# Patient Record
Sex: Female | Born: 1955
Health system: Southern US, Community
[De-identification: ages and names within clinical notes are randomized; demographics above are authoritative.]

## PROBLEM LIST (undated history)

## (undated) DIAGNOSIS — Z803 Family history of malignant neoplasm of breast: Secondary | ICD-10-CM

## (undated) DIAGNOSIS — M810 Age-related osteoporosis without current pathological fracture: Secondary | ICD-10-CM

## (undated) DIAGNOSIS — D649 Anemia, unspecified: Secondary | ICD-10-CM

## (undated) DIAGNOSIS — F419 Anxiety disorder, unspecified: Secondary | ICD-10-CM

## (undated) HISTORY — PX: DILATION AND CURETTAGE OF UTERUS: SHX78

## (undated) HISTORY — DX: Age-related osteoporosis without current pathological fracture: M81.0

## (undated) HISTORY — DX: Family history of malignant neoplasm of breast: Z80.3

## (undated) HISTORY — DX: Anxiety disorder, unspecified: F41.9

## (undated) HISTORY — DX: Anemia, unspecified: D64.9

---

## 1989-12-02 HISTORY — PX: TUBAL LIGATION: SHX77

## 2005-07-08 ENCOUNTER — Ambulatory Visit: Payer: Self-pay

## 2007-01-27 ENCOUNTER — Ambulatory Visit: Payer: Self-pay

## 2008-01-26 ENCOUNTER — Ambulatory Visit: Payer: Self-pay

## 2008-01-28 ENCOUNTER — Ambulatory Visit: Payer: Self-pay

## 2009-02-16 ENCOUNTER — Ambulatory Visit: Payer: Self-pay

## 2010-02-27 ENCOUNTER — Ambulatory Visit: Payer: Self-pay

## 2010-05-14 ENCOUNTER — Ambulatory Visit: Payer: Self-pay

## 2010-05-17 ENCOUNTER — Ambulatory Visit: Payer: Self-pay

## 2010-08-02 HISTORY — PX: COLONOSCOPY: SHX174

## 2010-08-27 ENCOUNTER — Ambulatory Visit: Payer: Self-pay | Admitting: Gastroenterology

## 2011-03-19 ENCOUNTER — Ambulatory Visit: Payer: Self-pay

## 2012-04-14 ENCOUNTER — Ambulatory Visit: Payer: Self-pay | Admitting: Endocrinology

## 2013-10-07 ENCOUNTER — Ambulatory Visit: Payer: Self-pay | Admitting: Endocrinology

## 2014-11-07 ENCOUNTER — Ambulatory Visit: Payer: Self-pay | Admitting: Endocrinology

## 2016-01-30 ENCOUNTER — Other Ambulatory Visit: Payer: Self-pay | Admitting: Endocrinology

## 2016-01-30 DIAGNOSIS — Z1231 Encounter for screening mammogram for malignant neoplasm of breast: Secondary | ICD-10-CM

## 2016-02-02 ENCOUNTER — Ambulatory Visit
Admission: RE | Admit: 2016-02-02 | Discharge: 2016-02-02 | Disposition: A | Discharge status: Home / Self Care | Payer: BLUE CROSS/BLUE SHIELD | Source: Ambulatory Visit | Attending: Endocrinology | Admitting: Endocrinology

## 2016-02-02 DIAGNOSIS — Z1231 Encounter for screening mammogram for malignant neoplasm of breast: Secondary | ICD-10-CM | POA: Insufficient documentation

## 2016-08-23 ENCOUNTER — Encounter: Payer: Self-pay | Admitting: Podiatry

## 2016-08-23 ENCOUNTER — Ambulatory Visit (INDEPENDENT_AMBULATORY_CARE_PROVIDER_SITE_OTHER): Payer: BLUE CROSS/BLUE SHIELD

## 2016-08-23 ENCOUNTER — Ambulatory Visit (INDEPENDENT_AMBULATORY_CARE_PROVIDER_SITE_OTHER): Payer: BLUE CROSS/BLUE SHIELD | Admitting: Podiatry

## 2016-08-23 VITALS — BP 113/71 | HR 76 | Resp 16 | Ht 64.0 in | Wt 144.0 lb

## 2016-08-23 DIAGNOSIS — M79672 Pain in left foot: Secondary | ICD-10-CM

## 2016-08-23 DIAGNOSIS — M722 Plantar fascial fibromatosis: Secondary | ICD-10-CM

## 2016-08-23 MED ORDER — DICLOFENAC SODIUM 75 MG PO TBEC: 75.0000 mg | DELAYED_RELEASE_TABLET | Freq: Two times a day (BID) | ORAL | 2 refills | Status: DC

## 2016-08-23 MED ORDER — TRIAMCINOLONE ACETONIDE 10 MG/ML IJ SUSP
10.0000 mg | Freq: Once | INTRAMUSCULAR | Status: AC
  Administered 2016-08-23: 10 mg

## 2016-08-23 NOTE — Patient Instructions (Signed)

## 2016-08-23 NOTE — Progress Notes (Signed)
   Subjective:    Patient ID: Audrey DeedWendy M Kinkead, female    DOB: 1956-01-17, 60 y.o.   MRN: [id number]  HPI Chief Complaint  Patient presents with  . Foot Pain    Left foot; heel; pt stated, "Pain is worse in the morning"; x6 month      Review of Systems  Constitutional: Positive for fatigue and unexpected weight change.  Psychiatric/Behavioral: The patient is nervous/anxious.   All other systems reviewed and are negative.      Objective:   Physical Exam        Assessment & Plan:

## 2016-08-25 NOTE — Progress Notes (Signed)
Subjective:     Patient ID: Audrey Leonard, female   DOB: 1956-09-05, 60 y.o.   MRN: [id number]  HPI patient states that she's getting a lot of pain in the plantar of the left heel and it's been present for at least 6 months and her daughter is getting married in the next several weeks   Review of Systems  All other systems reviewed and are negative.      Objective:   Physical Exam  Constitutional: She is oriented to person, place, and time.  Cardiovascular: Intact distal pulses.   Musculoskeletal: Normal range of motion.  Neurological: She is oriented to person, place, and time.  Skin: Skin is warm.  Vitals reviewed.  neurovascular status intact muscle strength adequate range of motion within normal limits with patient found to have acute inflammatory pain plantar aspect left heel at the insertional point tendon into the calcaneus with inflammation and fluid around the medial band. Patient's noted to have moderate depression of the arch does have good digital perfusion and is well oriented 3     Assessment:     Inflammatory fasciitis left heel at the insertional point of the tendon into the calcaneus with fluid buildup noted    Plan:     H&P x-rays reviewed and injected the plantar fascial left 3 mg Kenalog 5 mg Xylocaine and applied fascial brace. Gave instructions on physical therapy anti-inflammatories and long-term orthotics and patient will be reviewed again in the next several weeks  X-ray report indicated spur formation with no indications of stress fracture or arthritis

## 2016-09-06 ENCOUNTER — Ambulatory Visit (INDEPENDENT_AMBULATORY_CARE_PROVIDER_SITE_OTHER): Payer: BLUE CROSS/BLUE SHIELD | Admitting: Podiatry

## 2016-09-06 ENCOUNTER — Encounter: Payer: Self-pay | Admitting: Podiatry

## 2016-09-06 DIAGNOSIS — M722 Plantar fascial fibromatosis: Secondary | ICD-10-CM | POA: Diagnosis not present

## 2016-09-06 MED ORDER — TRIAMCINOLONE ACETONIDE 10 MG/ML IJ SUSP
10.0000 mg | Freq: Once | INTRAMUSCULAR | Status: AC
  Administered 2016-09-06: 10 mg

## 2016-09-06 NOTE — Patient Instructions (Signed)

## 2016-09-08 NOTE — Progress Notes (Signed)
Subjective:     Patient ID: Audrey Leonard, female   DOB: 02/12/1956, 60 y.o.   MRN: [id number]  HPI patient states she still having some pain but she's had definite improvement from previous visit   Review of Systems     Objective:   Physical Exam Neurovascular status intact with patient noted to have continued discomfort in the plantar heel left but improved from previous visit    Assessment:     Fasciitis-like symptoms improved but present    Plan:     Due to continued discomfort reinjected the plantar fascial left 3 mg Kenalog 5 mg Xylocaine and went ahead today and scanned for custom orthotics to reduce plantar pressure. Reappoint when orthotics returned

## 2016-09-26 ENCOUNTER — Ambulatory Visit (INDEPENDENT_AMBULATORY_CARE_PROVIDER_SITE_OTHER): Payer: BLUE CROSS/BLUE SHIELD | Admitting: Podiatry

## 2016-09-26 DIAGNOSIS — M722 Plantar fascial fibromatosis: Secondary | ICD-10-CM

## 2016-09-26 NOTE — Patient Instructions (Signed)

## 2016-11-18 ENCOUNTER — Other Ambulatory Visit: Payer: Self-pay | Admitting: Podiatry

## 2016-11-19 NOTE — Telephone Encounter (Signed)
Pt needs an appt prior to future refills. 

## 2016-12-13 ENCOUNTER — Other Ambulatory Visit: Payer: Self-pay | Admitting: Podiatry

## 2016-12-13 NOTE — Telephone Encounter (Signed)
Pt needs an appt prior to future refills. 

## 2017-02-28 ENCOUNTER — Other Ambulatory Visit: Payer: Self-pay | Admitting: Endocrinology

## 2017-02-28 DIAGNOSIS — Z1231 Encounter for screening mammogram for malignant neoplasm of breast: Secondary | ICD-10-CM

## 2017-03-26 ENCOUNTER — Ambulatory Visit
Admission: RE | Admit: 2017-03-26 | Discharge: 2017-03-26 | Disposition: A | Discharge status: Home / Self Care | Payer: BLUE CROSS/BLUE SHIELD | Source: Ambulatory Visit | Attending: Endocrinology | Admitting: Endocrinology

## 2017-03-26 DIAGNOSIS — Z1231 Encounter for screening mammogram for malignant neoplasm of breast: Secondary | ICD-10-CM | POA: Diagnosis present

## 2018-03-23 ENCOUNTER — Other Ambulatory Visit: Payer: Self-pay | Admitting: Endocrinology

## 2018-03-23 DIAGNOSIS — Z1231 Encounter for screening mammogram for malignant neoplasm of breast: Secondary | ICD-10-CM

## 2018-04-08 ENCOUNTER — Ambulatory Visit
Admission: RE | Admit: 2018-04-08 | Discharge: 2018-04-08 | Disposition: A | Discharge status: Home / Self Care | Payer: BLUE CROSS/BLUE SHIELD | Source: Ambulatory Visit | Attending: Endocrinology | Admitting: Endocrinology

## 2018-04-08 DIAGNOSIS — Z1231 Encounter for screening mammogram for malignant neoplasm of breast: Secondary | ICD-10-CM | POA: Diagnosis not present

## 2018-04-16 ENCOUNTER — Ambulatory Visit (INDEPENDENT_AMBULATORY_CARE_PROVIDER_SITE_OTHER): Payer: BLUE CROSS/BLUE SHIELD | Admitting: Obstetrics and Gynecology

## 2018-04-16 ENCOUNTER — Encounter: Payer: Self-pay | Admitting: Obstetrics and Gynecology

## 2018-04-16 VITALS — BP 110/72 | HR 69 | Ht 64.0 in | Wt 170.0 lb

## 2018-04-16 DIAGNOSIS — N951 Menopausal and female climacteric states: Secondary | ICD-10-CM

## 2018-04-16 DIAGNOSIS — Z01419 Encounter for gynecological examination (general) (routine) without abnormal findings: Secondary | ICD-10-CM

## 2018-04-16 DIAGNOSIS — M810 Age-related osteoporosis without current pathological fracture: Secondary | ICD-10-CM | POA: Insufficient documentation

## 2018-04-16 DIAGNOSIS — Z803 Family history of malignant neoplasm of breast: Secondary | ICD-10-CM | POA: Diagnosis not present

## 2018-04-16 DIAGNOSIS — Z1211 Encounter for screening for malignant neoplasm of colon: Secondary | ICD-10-CM | POA: Diagnosis not present

## 2018-04-16 DIAGNOSIS — Z1231 Encounter for screening mammogram for malignant neoplasm of breast: Secondary | ICD-10-CM | POA: Diagnosis not present

## 2018-04-16 DIAGNOSIS — Z1151 Encounter for screening for human papillomavirus (HPV): Secondary | ICD-10-CM

## 2018-04-16 DIAGNOSIS — Z79890 Hormone replacement therapy (postmenopausal): Secondary | ICD-10-CM | POA: Diagnosis not present

## 2018-04-16 DIAGNOSIS — Z124 Encounter for screening for malignant neoplasm of cervix: Secondary | ICD-10-CM | POA: Diagnosis not present

## 2018-04-16 DIAGNOSIS — Z1239 Encounter for other screening for malignant neoplasm of breast: Secondary | ICD-10-CM

## 2018-04-16 DIAGNOSIS — F419 Anxiety disorder, unspecified: Secondary | ICD-10-CM | POA: Insufficient documentation

## 2018-04-16 MED ORDER — ESTRADIOL-NORETHINDRONE ACET 1-0.5 MG PO TABS
1.0000 | ORAL_TABLET | Freq: Every day | ORAL | 12 refills | Status: DC
Start: 2018-04-16 — End: 2018-04-20

## 2018-04-16 NOTE — Patient Instructions (Signed)
I value your feedback and entrusting us with your care. If you get a [place] patient survey, I would appreciate you taking the time to let us know about your experience today. Thank you! 

## 2018-04-16 NOTE — Progress Notes (Addendum)
PCP: System, Pcp Not In   Chief Complaint  Patient presents with  . Gynecologic Exam    HPI:      [name] is a 62 y.o. No obstetric history on file. who LMP was No LMP recorded. Patient is postmenopausal., presents today for her annual examination.  Her menses are absent due to menopause. She does not have intermenstrual bleeding. She does have vasomotor sx that started last fall. Was placed on estradiol 0.5 mg by Dr. Ronnald Collum with sx relief. Pt has her uterus, however, and isn't on a progesterone.  Sex activity: not sexually active. She does not have vaginal dryness.  Last Pap: January 16, 2017  Results were: ASCUS with NEGATIVE high risk HPV . Repeat due today.  Hx of STDs: none  Last mammogram: Apr 08, 2018  Results were: normal--routine follow-up in 12 months There is a FH of breast cancer in her MGM and 2 sisters, one of which had neg genetic testing at [facility] about 3 yrs ago (most likely [facility] panel). There is no FH of ovarian cancer. The patient does not do self-breast exams.  Colonoscopy: colonoscopy 10 years ago without abnormalities with Dr. Candace Cruise.  Repeat due after 10 years.  DEXA: 2014 with osteoporosis; treated with reclast by Dr. Ronnald Collum.   Tobacco use: The patient denies current or previous tobacco use. Alcohol use: social drinker Exercise: not active  She does get adequate calcium but not Vitamin D in her diet.  Labs with PCP.   Past Medical History:  Diagnosis Date  . Anemia   . Anxiety   . Family history of breast cancer   . Osteoporosis     Past Surgical History:  Procedure Laterality Date  . COLONOSCOPY  2011  . TUBAL LIGATION  1991    Family History  Problem Relation Age of Onset  . Breast cancer Sister 37  . Breast cancer Maternal Grandmother 60  . Breast cancer Sister 40       gene neg at [facility] about 2016    Social History   Socioeconomic History  . Marital status: Single    Spouse name: Not on file  . Number of children:  Not on file  . Years of education: Not on file  . Highest education level: Not on file  Occupational History  . Not on file  Social Needs  . Financial resource strain: Not on file  . Food insecurity:    Worry: Not on file    Inability: Not on file  . Transportation needs:    Medical: Not on file    Non-medical: Not on file  Tobacco Use  . Smoking status: Never Smoker  . Smokeless tobacco: Never Used  Substance and Sexual Activity  . Alcohol use: Not Currently  . Drug use: Never  . Sexual activity: Not Currently    Birth control/protection: Post-menopausal  Lifestyle  . Physical activity:    Days per week: Not on file    Minutes per session: Not on file  . Stress: Not on file  Relationships  . Social connections:    Talks on phone: Not on file    Gets together: Not on file    Attends religious service: Not on file    Active member of club or organization: Not on file    Attends meetings of clubs or organizations: Not on file    Relationship status: Not on file  . Intimate partner violence:    Fear of current or  ex partner: Not on file    Emotionally abused: Not on file    Physically abused: Not on file    Forced sexual activity: Not on file  Other Topics Concern  . Not on file  Social History Narrative  . Not on file    Outpatient Medications Prior to Visit  Medication Sig Dispense Refill  . ALPRAZolam (XANAX) 0.5 MG tablet Take 0.5 mg by mouth 2 (two) times daily as needed.  5  . estradiol (ESTRACE) 0.5 MG tablet Take 0.5 mg by mouth daily.  5  . ferrous gluconate (FERGON) 324 MG tablet Take 324 mg by mouth daily with breakfast.    . diclofenac (VOLTAREN) 75 MG EC tablet TAKE 1 TABLET (75 MG TOTAL) BY MOUTH 2 (TWO) TIMES DAILY. (Patient not taking: Reported on 04/16/2018) 24 tablet 0  . ALPRAZolam (XANAX) 0.25 MG tablet Take 0.25 mg by mouth daily.  5   No facility-administered medications prior to visit.     ROS:  Review of Systems  Constitutional: Negative  for fatigue, fever and unexpected weight change.  Respiratory: Negative for cough, shortness of breath and wheezing.   Cardiovascular: Negative for chest pain, palpitations and leg swelling.  Gastrointestinal: Negative for blood in stool, constipation, diarrhea, nausea and vomiting.  Endocrine: Negative for cold intolerance, heat intolerance and polyuria.  Genitourinary: Negative for dyspareunia, dysuria, flank pain, frequency, genital sores, hematuria, menstrual problem, pelvic pain, urgency, vaginal bleeding, vaginal discharge and vaginal pain.  Musculoskeletal: Negative for back pain, joint swelling and myalgias.  Skin: Negative for rash.  Neurological: Negative for dizziness, syncope, light-headedness, numbness and headaches.  Hematological: Negative for adenopathy.  Psychiatric/Behavioral: Negative for agitation, confusion, sleep disturbance and suicidal ideas. The patient is not nervous/anxious.   BREAST: No symptoms   Objective: BP 110/72   Pulse 69   Ht _0  (1.626 m)   Wt 170 lb (77.1 kg)   BMI 29.18 kg/m    Physical Exam  Constitutional: She is oriented to person, place, and time. She appears well-developed and well-nourished.  Genitourinary: Vagina normal and uterus normal. There is no rash or tenderness on the right labia. There is no rash or tenderness on the left labia. No erythema or tenderness in the vagina. No vaginal discharge found. Right adnexum does not display mass and does not display tenderness. Left adnexum does not display mass and does not display tenderness. Cervix does not exhibit motion tenderness or polyp. Uterus is not enlarged or tender.  Neck: Normal range of motion. No thyromegaly present.  Cardiovascular: Normal rate, regular rhythm and normal heart sounds.  No murmur heard. Pulmonary/Chest: Effort normal and breath sounds normal. Right breast exhibits no mass, no nipple discharge, no skin change and no tenderness. Left breast exhibits no mass, no  nipple discharge, no skin change and no tenderness.  Abdominal: Soft. There is no tenderness. There is no guarding.  Musculoskeletal: Normal range of motion.  Neurological: She is alert and oriented to person, place, and time. No cranial nerve deficit.  Psychiatric: She has a normal mood and affect. Her behavior is normal.  Vitals reviewed.   Assessment/Plan:  Encounter for annual routine gynecological examination  Cervical cancer screening - Plan: IGP, Aptima HPV  Screening for HPV (human papillomavirus) - Will call with results.  - Plan: IGP, Aptima HPV  Screening for breast cancer - Pt is current on mammo. At increased risk based on FH. Add monthly SBE, cont yearly CBE and mammos. Add Vit D3.  Family  history of breast cancer - Affected sister was gene neg, most likely full panel since done at [facility] ~3 yrs ago. Pt to confirm results. If just BRCA, pt qualifies for cancer genetic testing  Hormone replacement therapy (HRT) - Rx change from estradiol to activella for prog component. Rx eRxd. F/u prn sx. F/u prn DUB sx. - Plan: estradiol-norethindrone (ACTIVELLA) 1-0.5 MG tablet  Vasomotor symptoms due to menopause - Plan: estradiol-norethindrone (ACTIVELLA) 1-0.5 MG tablet  Screening for colon cancer - REfer to GI for scr colonoscopy due to age later this yr. - Plan: Ambulatory referral to Gastroenterology   Meds ordered this encounter  Medications  . estradiol-norethindrone (ACTIVELLA) 1-0.5 MG tablet    Sig: Take 1 tablet by mouth daily.    Dispense:  30 tablet    Refill:  12    Order Specific Question:   Supervising Provider    Answer:   Gae Dry [[id number]]            GYN counsel breast self exam, mammography screening, use and side effects of HRT, menopause, adequate intake of calcium and vitamin D, diet and exercise    F/U  Return in about 1 year (around 04/17/2019).  Shyquan Stallbaumer B. Velvet Moomaw, PA-C 04/16/2018 9:57 AM

## 2018-04-19 LAB — IGP, APTIMA HPV
HPV Aptima: NEGATIVE
PAP Smear Comment: 0

## 2018-04-20 ENCOUNTER — Encounter: Payer: Self-pay | Admitting: Obstetrics and Gynecology

## 2018-04-20 ENCOUNTER — Telehealth: Payer: Self-pay | Admitting: Obstetrics and Gynecology

## 2018-04-20 MED ORDER — PROGESTERONE MICRONIZED 100 MG PO CAPS: 100.0000 mg | ORAL_CAPSULE | Freq: Every day | ORAL | 3 refills | Status: DC

## 2018-04-20 MED ORDER — ESTRADIOL 0.5 MG PO TABS: 0.5000 mg | ORAL_TABLET | Freq: Every day | ORAL | 3 refills | Status: DC

## 2018-04-20 NOTE — Telephone Encounter (Signed)
Pt states activella Rx was very expensive. Would rather go back to estradiol 0.5 mg and add a prog Rx. Rx prometrium 100 mg QHS, Rx RF estradiol 0.5 mg. F/u prn.

## 2018-05-07 ENCOUNTER — Telehealth: Payer: Self-pay | Admitting: Obstetrics and Gynecology

## 2018-05-07 NOTE — Telephone Encounter (Signed)
Patient calling this morning stating she is having bleeding and cramping.  She says her hormone med was changed at last visit and told to give [facility] a call if this happened.

## 2018-05-07 NOTE — Telephone Encounter (Signed)
Needs to have u/s. Pls call pt to schedule and I will call with results. Thanks.

## 2018-05-08 NOTE — Telephone Encounter (Signed)
Pt definitely needs ultrasound to see lining of uterus since she was on estrogen without progesterone. Bleeding with HRT isn't normal so she needs further eval. If no ultrasound, she needs to stop all HRT. Pls let me know what she decides. Thx.

## 2018-05-08 NOTE — Telephone Encounter (Signed)
Patient is calling wanting to know if she needs to stop taking her hormone medication. Patient is still complaining of bleeding. Patient was advise to schedule ultrasound but has declined. Please advise

## 2018-05-11 NOTE — Telephone Encounter (Signed)
Spoke with pt re: PMB with HRT. Pt understands need for GYN u/s and poss EMB based on u/s results. Pt had 6 months of unopposed ERT from PCP. Still having heavy period bleeding. Hx of leio in past. Taking HRT QOD due to vasomotor sx.

## 2018-05-13 ENCOUNTER — Ambulatory Visit: Payer: BLUE CROSS/BLUE SHIELD

## 2018-05-13 ENCOUNTER — Encounter: Payer: Self-pay | Admitting: Obstetrics and Gynecology

## 2018-05-13 ENCOUNTER — Ambulatory Visit: Payer: BLUE CROSS/BLUE SHIELD | Admitting: Obstetrics and Gynecology

## 2018-05-13 ENCOUNTER — Other Ambulatory Visit: Payer: Self-pay | Admitting: Obstetrics & Gynecology

## 2018-05-13 VITALS — BP 120/80 | HR 45 | Ht 64.0 in | Wt 166.0 lb

## 2018-05-13 DIAGNOSIS — Z79890 Hormone replacement therapy (postmenopausal): Secondary | ICD-10-CM

## 2018-05-13 DIAGNOSIS — N950 Postmenopausal bleeding: Secondary | ICD-10-CM | POA: Diagnosis not present

## 2018-05-13 DIAGNOSIS — R9389 Abnormal findings on diagnostic imaging of other specified body structures: Secondary | ICD-10-CM

## 2018-05-13 NOTE — Progress Notes (Signed)
System, Pcp Not In   Chief Complaint  Patient presents with  . Follow-up    PMB, U/S RESULTS,     HPI:      [name] is a 62 y.o. [phone] who LMP was No LMP recorded. Patient is postmenopausal., presents today for u/s f/u for PMB with HRT. Pt has been postmenopausal for a couple yrs without bleeding. Was started on unopposed estradiol 0.5 mg by Dr. Patrecia Pace about 6 months ago with sx relief of vasomotor sx. Pt saw me for annual 04/16/18 and was changed to activella for progesterone component. She then started having period type bleeding last wk. She has been changing pads about QID, no clots. Flow is a little lighter today. Had cramping initially that resolved. Hx of leio in the past and pt thought sx related. She has stopped all HRT.   Past Medical History:  Diagnosis Date  . Anemia   . Anxiety   . Family history of breast cancer   . Osteoporosis     Past Surgical History:  Procedure Laterality Date  . COLONOSCOPY  08/2010   Dr. Bluford Kaufmann at [facility]; repeat due in 10 yrs.  . COLONOSCOPY  2010  . DILATION AND CURETTAGE OF UTERUS     x2; [facility]  . TUBAL LIGATION  1991    Family History  Problem Relation Age of Onset  . Breast cancer Sister 2  . Breast cancer Maternal Grandmother 60  . Breast cancer Sister 6       gene neg at [facility] about 2016  . Heart disease Father     Social History   Socioeconomic History  . Marital status: Divorced    Spouse name: Not on file  . Number of children: 3  . Years of education: 80  . Highest education level: Not on file  Occupational History  . Occupation: Human resources officer    Comment: SELF EMPLOYED  Social Needs  . Financial resource strain: Not on file  . Food insecurity:    Worry: Not on file    Inability: Not on file  . Transportation needs:    Medical: Not on file    Non-medical: Not on file  Tobacco Use  . Smoking status: Never Smoker  . Smokeless tobacco: Never Used  Substance and Sexual Activity  . Alcohol use: Not  Currently  . Drug use: Never  . Sexual activity: Not Currently    Birth control/protection: Post-menopausal  Lifestyle  . Physical activity:    Days per week: Not on file    Minutes per session: Not on file  . Stress: Not on file  Relationships  . Social connections:    Talks on phone: Not on file    Gets together: Not on file    Attends religious service: Not on file    Active member of club or organization: Not on file    Attends meetings of clubs or organizations: Not on file    Relationship status: Not on file  . Intimate partner violence:    Fear of current or ex partner: Not on file    Emotionally abused: Not on file    Physically abused: Not on file    Forced sexual activity: Not on file  Other Topics Concern  . Not on file  Social History Narrative  . Not on file    Outpatient Medications Prior to Visit  Medication Sig Dispense Refill  . ALPRAZolam (XANAX) 0.5 MG tablet Take 0.5 mg by  mouth 2 (two) times daily as needed.  5  . ferrous gluconate (FERGON) 324 MG tablet Take 324 mg by mouth daily with breakfast.    . diclofenac (VOLTAREN) 75 MG EC tablet TAKE 1 TABLET (75 MG TOTAL) BY MOUTH 2 (TWO) TIMES DAILY. (Patient not taking: Reported on 04/16/2018) 24 tablet 0  . estradiol (ESTRACE) 0.5 MG tablet Take 1 tablet (0.5 mg total) by mouth daily. (Patient not taking: Reported on 05/13/2018) 90 tablet 3  . progesterone (PROMETRIUM) 100 MG capsule Take 1 capsule (100 mg total) by mouth daily. (Patient not taking: Reported on 05/13/2018) 90 capsule 3   No facility-administered medications prior to visit.     ROS:  Review of Systems  Constitutional: Negative for fever.  Gastrointestinal: Negative for blood in stool, constipation, diarrhea, nausea and vomiting.  Genitourinary: Positive for menstrual problem. Negative for dyspareunia, dysuria, flank pain, frequency, hematuria, urgency, vaginal bleeding, vaginal discharge and vaginal pain.  Musculoskeletal: Negative for back  pain.  Skin: Negative for rash.   BREAST: No symptoms   OBJECTIVE:   Vitals:  BP 120/80   Pulse (!) 45   Ht 5\' 4"  (1.626 m)   Wt 166 lb (75.3 kg)   BMI 28.49 kg/m   Physical Exam  Constitutional: She is oriented to person, place, and time. Vital signs are normal. She appears well-developed.  Pulmonary/Chest: Effort normal.  Genitourinary: There is no rash, tenderness or lesion on the right labia. There is no rash, tenderness or lesion on the left labia. Cervix exhibits no discharge and no friability. There is bleeding in the vagina. No erythema or tenderness in the vagina. No vaginal discharge found.  Musculoskeletal: Normal range of motion.  Neurological: She is alert and oriented to person, place, and time.  Psychiatric: She has a normal mood and affect. Her behavior is normal. Thought content normal.  Vitals reviewed.   Results:       Endometrial Biopsy After discussion with the patient regarding her abnormal uterine bleeding I recommended that she proceed with an endometrial biopsy for further diagnosis. The risks, benefits, alternatives, and indications for an endometrial biopsy were discussed with the patient in detail. She understood the risks including infection, bleeding, cervical laceration and uterine perforation.  Verbal consent was obtained.   PROCEDURE NOTE:  Pipelle endometrial biopsy was performed using aseptic technique with iodine preparation.  The uterus was sounded to a length of 8.0 cm.  Adequate sampling was obtained with minimal blood loss.  The patient tolerated the procedure well.  Disposition will be pending pathology.  Assessment/Plan: Postmenopausal bleeding - S/P unopposed ERT. EM=10 mm on u/s. EMB today. Will f/u with pathology results and further mgmt.  - Plan: Pathology  Thickened endometrium - Plan: Pathology  Hormone replacement therapy (HRT) - Hold off on HRT currently. If pathology neg, can restart HRT eventually after tx.     Return  if symptoms worsen or fail to improve.  Markiya Keefe B. Malcom Selmer, PA-C 05/13/2018 11:31 AM

## 2018-05-13 NOTE — Patient Instructions (Signed)
I value your feedback and entrusting us with your care. If you get a [place] patient survey, I would appreciate you taking the time to let us know about your experience today. Thank you! 

## 2018-05-15 LAB — PATHOLOGY

## 2018-05-21 ENCOUNTER — Telehealth: Payer: Self-pay | Admitting: Obstetrics and Gynecology

## 2018-05-21 NOTE — Telephone Encounter (Signed)
Pt aware of neg EMB results after PMB from unopposed ERT from PCP. Bleeding stopped a few days ago. Pt's vasomotor sx are tolerable. Wants to hold off on HRT for now, but is a candidate if sx are bad in future. F/u prn.

## 2018-07-02 ENCOUNTER — Encounter: Payer: Self-pay | Admitting: Obstetrics and Gynecology

## 2019-04-22 ENCOUNTER — Ambulatory Visit: Payer: BLUE CROSS/BLUE SHIELD | Admitting: Obstetrics and Gynecology

## 2019-04-30 ENCOUNTER — Other Ambulatory Visit: Payer: Self-pay | Admitting: Endocrinology

## 2019-04-30 DIAGNOSIS — Z1231 Encounter for screening mammogram for malignant neoplasm of breast: Secondary | ICD-10-CM

## 2019-06-03 ENCOUNTER — Encounter: Payer: Self-pay | Admitting: Obstetrics and Gynecology

## 2019-06-03 ENCOUNTER — Ambulatory Visit (INDEPENDENT_AMBULATORY_CARE_PROVIDER_SITE_OTHER): Payer: BLUE CROSS/BLUE SHIELD | Admitting: Obstetrics and Gynecology

## 2019-06-03 ENCOUNTER — Other Ambulatory Visit: Payer: Self-pay

## 2019-06-03 VITALS — BP 118/70 | Ht 64.0 in | Wt 173.6 lb

## 2019-06-03 DIAGNOSIS — Z803 Family history of malignant neoplasm of breast: Secondary | ICD-10-CM

## 2019-06-03 DIAGNOSIS — Z01419 Encounter for gynecological examination (general) (routine) without abnormal findings: Secondary | ICD-10-CM | POA: Diagnosis not present

## 2019-06-03 DIAGNOSIS — Z1239 Encounter for other screening for malignant neoplasm of breast: Secondary | ICD-10-CM

## 2019-06-03 NOTE — Progress Notes (Signed)
PCP: System, Pcp Not In   Chief Complaint  Patient presents with  . Gynecologic Exam    HPI:      [name] is a 63 y.o. No obstetric history on file. who LMP was No LMP recorded. Patient is postmenopausal., presents today for her annual examination.  Her menses are absent due to menopause. She no longer has postmenopausal bleeding. Did HRT last yr with subsequent PMB and thickened EM on u/s. Had neg EMB. Pt decided to stop hormones and is fine without them.   Sex activity: not sexually active. She does not have vaginal dryness.  Last Pap: 04/16/18  Results were: no abnormalities/neg HPV DNA.  Hx of STDs: none  Last mammogram: Apr 08, 2018  Results were: normal--routine follow-up in 12 months. Has appt 06/11/19. There is a FH of breast cancer in her MGM and 2 sisters, one of which had neg genetic testing at [facility] about 3 yrs ago (most likely [facility] panel). There is no FH of ovarian cancer. The patient does not do self-breast exams.  Colonoscopy: colonoscopy 2011 without abnormalities with Dr. Candace Cruise.  Repeat due after 10 years.  DEXA: 2014 with osteoporosis; treated with reclast by Dr. Ronnald Collum. Has upcoming f/u.   Tobacco use: The patient denies current or previous tobacco use. Alcohol use: social drinker Exercise: not active  She does get adequate calcium but not Vitamin D in her diet. Doesn't take her Vit D supp.  Labs with PCP.   Past Medical History:  Diagnosis Date  . Anemia   . Anxiety   . Family history of breast cancer   . Osteoporosis     Past Surgical History:  Procedure Laterality Date  . COLONOSCOPY  08/2010   Dr. Candace Cruise at [facility]; repeat due in 10 yrs.  Marland Kitchen DILATION AND CURETTAGE OF UTERUS     x2; [facility]  . TUBAL LIGATION  1991    Family History  Problem Relation Age of Onset  . Breast cancer Sister 25  . Breast cancer Maternal Grandmother 60  . Breast cancer Sister 42       gene neg at [facility] about 2016  . Heart disease Father     Social History    Socioeconomic History  . Marital status: Divorced    Spouse name: Not on file  . Number of children: 3  . Years of education: 66  . Highest education level: Not on file  Occupational History  . Occupation: Development worker, community    Comment: SELF EMPLOYED  Social Needs  . Financial resource strain: Not on file  . Food insecurity    Worry: Not on file    Inability: Not on file  . Transportation needs    Medical: Not on file    Non-medical: Not on file  Tobacco Use  . Smoking status: Never Smoker  . Smokeless tobacco: Never Used  Substance and Sexual Activity  . Alcohol use: Not Currently  . Drug use: Never  . Sexual activity: Not Currently    Birth control/protection: Post-menopausal  Lifestyle  . Physical activity    Days per week: Not on file    Minutes per session: Not on file  . Stress: Not on file  Relationships  . Social Herbalist on phone: Not on file    Gets together: Not on file    Attends religious service: Not on file    Active member of club or organization: Not on file  Attends meetings of clubs or organizations: Not on file    Relationship status: Not on file  . Intimate partner violence    Fear of current or ex partner: Not on file    Emotionally abused: Not on file    Physically abused: Not on file    Forced sexual activity: Not on file  Other Topics Concern  . Not on file  Social History Narrative  . Not on file    Current Outpatient Medications:  .  ALPRAZolam (XANAX) 0.5 MG tablet, Take 0.5 mg by mouth 2 (two) times daily as needed., Disp: , Rfl: 5 .  escitalopram (LEXAPRO) 20 MG tablet, TAKE 1 TABLET BY MOUTH 1 TIME A DAY, Disp: , Rfl:  .  ferrous gluconate (FERGON) 324 MG tablet, Take 324 mg by mouth daily with breakfast., Disp: , Rfl:  .  Vitamin D, Ergocalciferol, (DRISDOL) 1.25 MG (50000 UT) CAPS capsule, , Disp: , Rfl:    ROS:  Review of Systems  Constitutional: Negative for fatigue, fever and unexpected weight change.   Respiratory: Negative for cough, shortness of breath and wheezing.   Cardiovascular: Negative for chest pain, palpitations and leg swelling.  Gastrointestinal: Negative for blood in stool, constipation, diarrhea, nausea and vomiting.  Endocrine: Negative for cold intolerance, heat intolerance and polyuria.  Genitourinary: Negative for dyspareunia, dysuria, flank pain, frequency, genital sores, hematuria, menstrual problem, pelvic pain, urgency, vaginal bleeding, vaginal discharge and vaginal pain.  Musculoskeletal: Negative for back pain, joint swelling and myalgias.  Skin: Negative for rash.  Neurological: Negative for dizziness, syncope, light-headedness, numbness and headaches.  Hematological: Negative for adenopathy.  Psychiatric/Behavioral: Negative for agitation, confusion, sleep disturbance and suicidal ideas. The patient is not nervous/anxious.   BREAST: No symptoms   Objective: BP 118/70   Ht 5\' 4"  (1.626 m)   Wt 173 lb 9.6 oz (78.7 kg)   BMI 29.80 kg/m    Physical Exam Constitutional:      Appearance: She is well-developed.  Genitourinary:     Vulva, vagina, uterus, right adnexa and left adnexa normal.     No vulval lesion or tenderness noted.     No vaginal discharge, erythema or tenderness.     No cervical motion tenderness or polyp.     Uterus is not enlarged or tender.     No right or left adnexal mass present.     Right adnexa not tender.     Left adnexa not tender.  Neck:     Musculoskeletal: Normal range of motion.     Thyroid: No thyromegaly.  Cardiovascular:     Rate and Rhythm: Normal rate and regular rhythm.     Heart sounds: Normal heart sounds. No murmur.  Pulmonary:     Effort: Pulmonary effort is normal.     Breath sounds: Normal breath sounds.  Chest:     Breasts:        Right: No mass, nipple discharge, skin change or tenderness.        Left: No mass, nipple discharge, skin change or tenderness.  Abdominal:     Palpations: Abdomen is soft.      Tenderness: There is no abdominal tenderness. There is no guarding.  Musculoskeletal: Normal range of motion.  Neurological:     General: No focal deficit present.     Mental Status: She is alert and oriented to person, place, and time.     Cranial Nerves: No cranial nerve deficit.  Skin:    General: Skin is  warm and dry.  Psychiatric:        Mood and Affect: Mood normal.        Behavior: Behavior normal.        Thought Content: Thought content normal.        Judgment: Judgment normal.  Vitals signs reviewed.     Assessment/Plan:  Encounter for annual routine gynecological examination -   Screening for breast cancer - Plan: MM 3D SCREEN BREAST BILATERAL, pt has upcoming mammo.  Family history of breast cancer - Plan: MM 3D SCREEN BREAST BILATERAL, Start SBE, cont yearly CBE and mammo. Cont Vit D supp.          GYN counsel breast self exam, mammography screening, use and side effects of HRT, menopause, adequate intake of calcium and vitamin D, diet and exercise    F/U  Return in about 1 year (around 06/02/2020).  Cherisa Brucker B. Tyeesha Riker, PA-C 06/03/2019 2:17 PM

## 2019-06-03 NOTE — Patient Instructions (Signed)
I value your feedback and entrusting us with your care. If you get a [facility] patient survey, I would appreciate you taking the time to let us know about your experience today. Thank you! 

## 2019-06-11 ENCOUNTER — Other Ambulatory Visit: Payer: Self-pay

## 2019-06-11 ENCOUNTER — Ambulatory Visit
Admission: RE | Admit: 2019-06-11 | Discharge: 2019-06-11 | Disposition: A | Discharge status: Home / Self Care | Payer: BLUE CROSS/BLUE SHIELD | Source: Ambulatory Visit | Attending: Endocrinology | Admitting: Endocrinology

## 2019-06-11 DIAGNOSIS — Z1231 Encounter for screening mammogram for malignant neoplasm of breast: Secondary | ICD-10-CM

## 2020-05-15 IMAGING — MG DIGITAL SCREENING BILATERAL MAMMOGRAM WITH TOMO AND CAD
6 of 10 series · 6 of 30 positions shown · non-contrast
Comparison: Previous exam(s).

CLINICAL DATA: Screening.

EXAM:
DIGITAL SCREENING BILATERAL MAMMOGRAM WITH TOMO AND CAD

[L MLO synth-2D (1 of 2)]
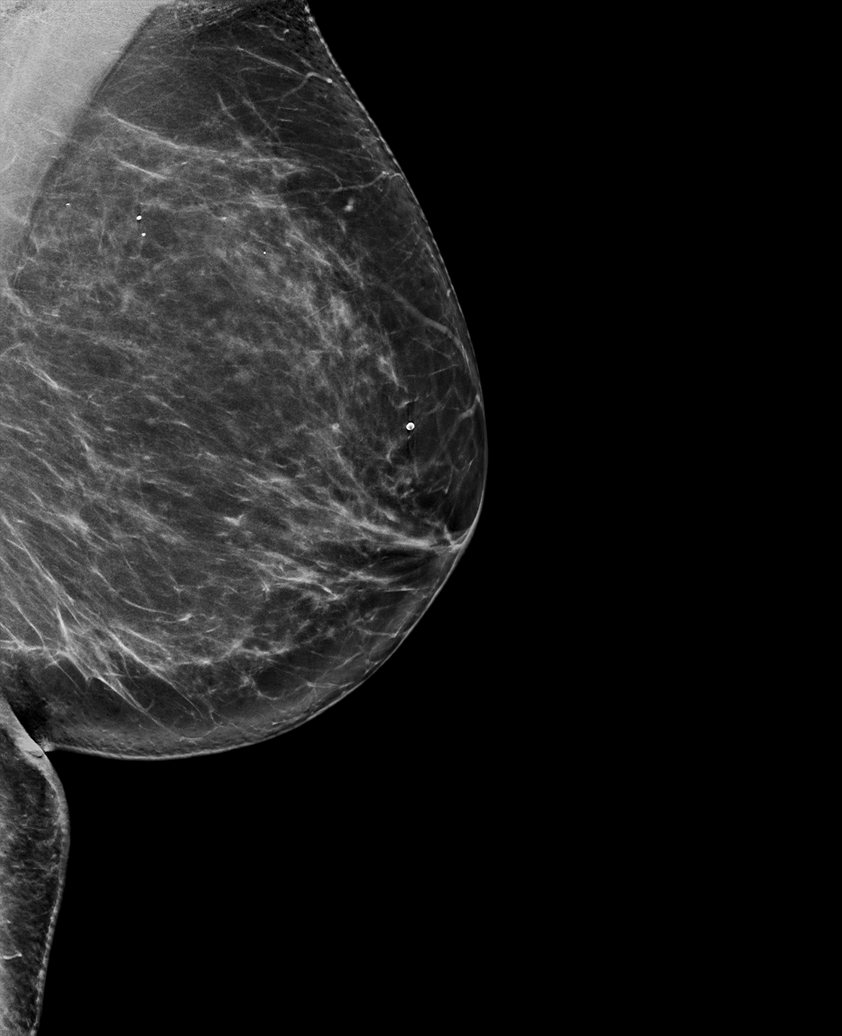

[L CC synth-2D]
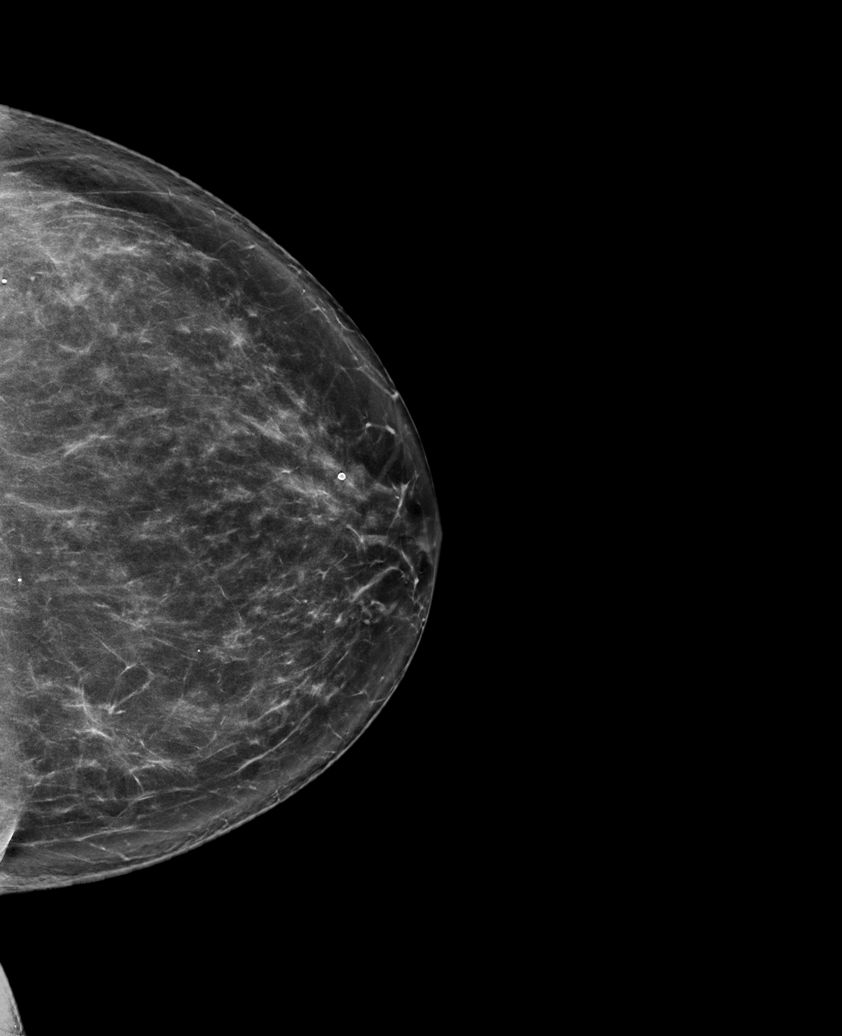

[R CC synth-2D]
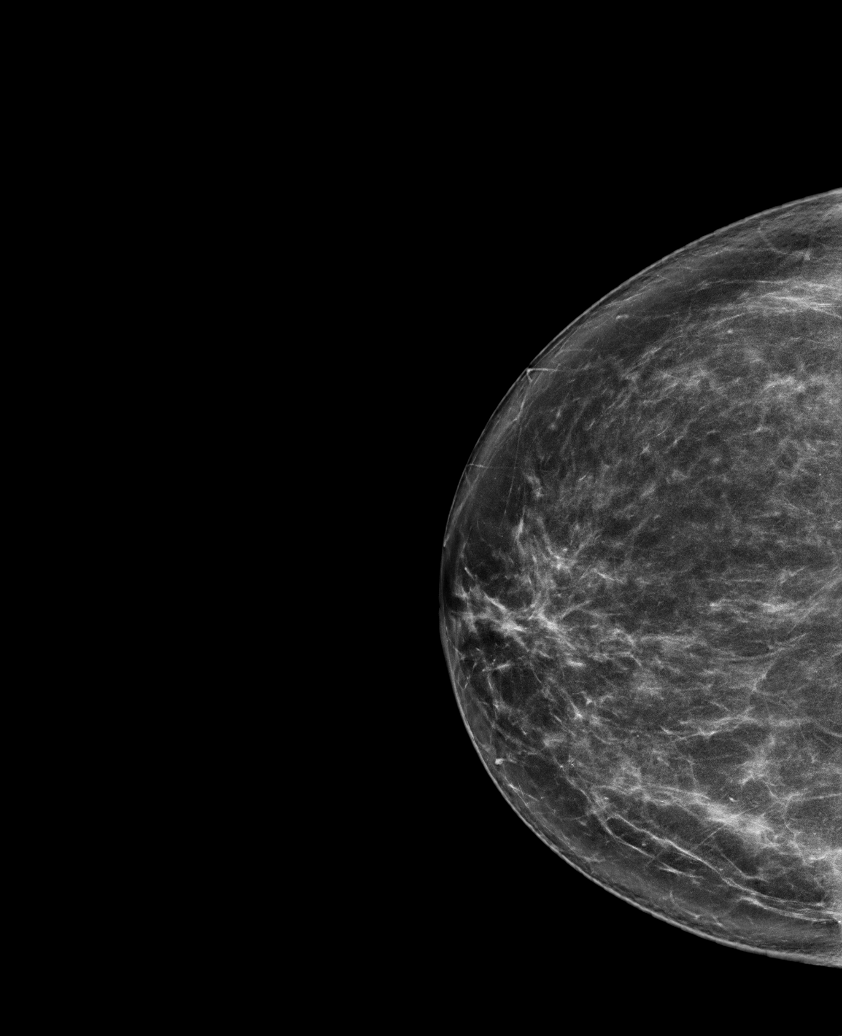

[L MLO synth-2D (2 of 2)]
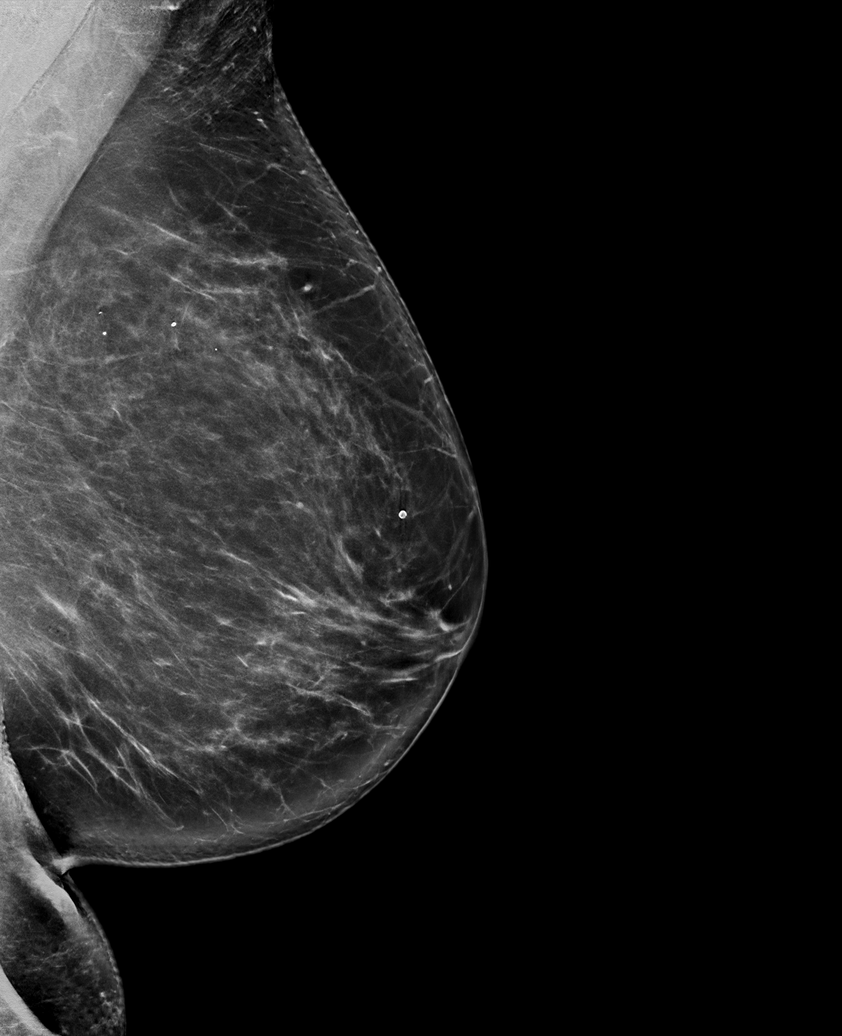

[R MLO synth-2D]
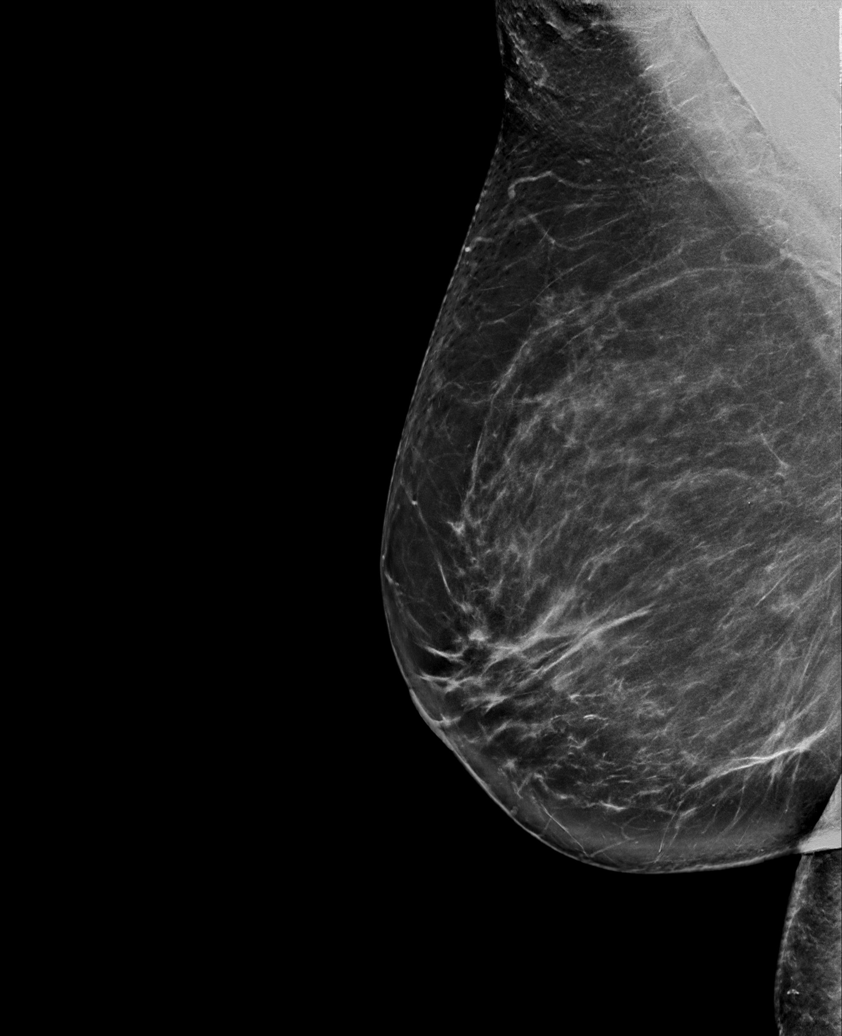

[L MLO tomo · tomo slice 47/92.0]
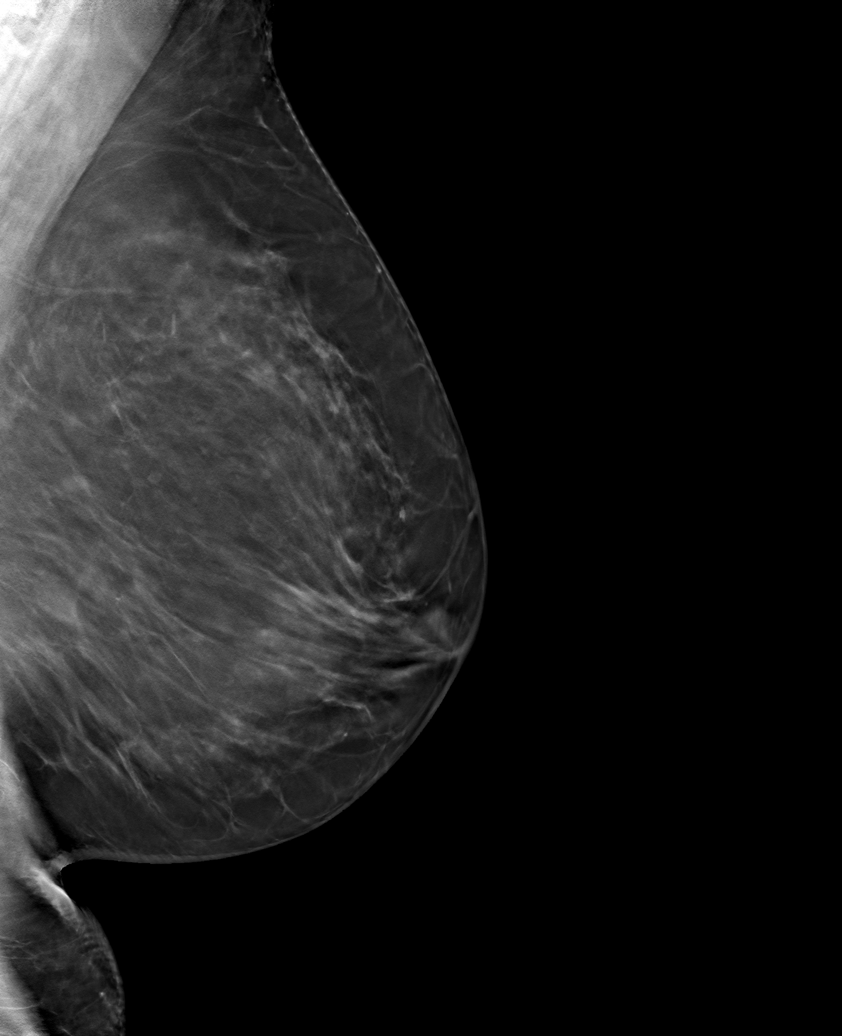

[6 of 30 positions shown; findings below may reference images not displayed]

ACR Breast Density Category b: There are scattered areas of
fibroglandular density.
FINDINGS: There are no findings suspicious for malignancy. Images were
processed with CAD.
IMPRESSION: No mammographic evidence of malignancy. A result letter of this
screening mammogram will be mailed directly to the patient.

RECOMMENDATION:
Screening mammogram in one year. (Code:CN-U-775)

BI-RADS CATEGORY  1: Negative.

## 2021-12-14 DIAGNOSIS — Z1231 Encounter for screening mammogram for malignant neoplasm of breast: Secondary | ICD-10-CM | POA: Diagnosis not present

## 2021-12-14 DIAGNOSIS — Z1239 Encounter for other screening for malignant neoplasm of breast: Secondary | ICD-10-CM | POA: Diagnosis not present

## 2021-12-17 DIAGNOSIS — M9905 Segmental and somatic dysfunction of pelvic region: Secondary | ICD-10-CM | POA: Diagnosis not present

## 2021-12-17 DIAGNOSIS — M9903 Segmental and somatic dysfunction of lumbar region: Secondary | ICD-10-CM | POA: Diagnosis not present

## 2021-12-17 DIAGNOSIS — M5136 Other intervertebral disc degeneration, lumbar region: Secondary | ICD-10-CM | POA: Diagnosis not present

## 2021-12-17 DIAGNOSIS — M955 Acquired deformity of pelvis: Secondary | ICD-10-CM | POA: Diagnosis not present

## 2022-01-10 DIAGNOSIS — M8588 Other specified disorders of bone density and structure, other site: Secondary | ICD-10-CM | POA: Diagnosis not present

## 2022-04-07 ENCOUNTER — Ambulatory Visit
Admission: RE | Admit: 2022-04-07 | Discharge: 2022-04-07 | Disposition: A | Discharge status: Home / Self Care | Payer: 59 | Source: Ambulatory Visit

## 2022-04-07 VITALS — BP 114/74 | HR 75 | Temp 98.4°F | Resp 18

## 2022-04-07 DIAGNOSIS — L237 Allergic contact dermatitis due to plants, except food: Secondary | ICD-10-CM

## 2022-04-07 DIAGNOSIS — J45901 Unspecified asthma with (acute) exacerbation: Secondary | ICD-10-CM | POA: Diagnosis not present

## 2022-04-07 MED ORDER — PREDNISONE 10 MG (21) PO TBPK: ORAL_TABLET | Freq: Every day | ORAL | 0 refills | Status: AC

## 2022-04-07 MED ORDER — METHYLPREDNISOLONE SODIUM SUCC 125 MG IJ SOLR
80.0000 mg | Freq: Once | INTRAMUSCULAR | Status: AC
  Administered 2022-04-07: 80 mg via INTRAMUSCULAR

## 2022-04-07 MED ORDER — ALBUTEROL SULFATE HFA 108 (90 BASE) MCG/ACT IN AERS: 1.0000 | INHALATION_SPRAY | Freq: Four times a day (QID) | RESPIRATORY_TRACT | 0 refills | Status: AC | PRN

## 2022-04-07 NOTE — ED Provider Notes (Signed)
?[facility] ? ? ? ?CSN: [id number] ?Arrival date & time: 04/07/22  1308 ? ? ?  ? ?History   ?Chief Complaint ?Chief Complaint  ?Patient presents with  ? Poison Ivy  ?  Have had a rash for 10 days. - Entered by patient  ? ? ?HPI ?Audrey Leonard is a 66 y.o. female.  Patient presents with pruritic rash on her trunk and extremities x1 week after coming in contact with poison sumac in her yard.  The rash is spreading.  Treatment with topical anti-itch cream.  She also reports wheezing and shortness of breath last night.  She denies fever, chills, sore throat, cough, vomiting, diarrhea, or other symptoms.  Patient reports history of asthma but does not regularly use an albuterol inhaler and does not have one currently. ? ?The history is provided by the patient and medical records.  ? ?Past Medical History:  ?Diagnosis Date  ? Anemia   ? Anxiety   ? Family history of breast cancer   ? Osteoporosis   ? ? ?Patient Active Problem List  ? Diagnosis Date Noted  ? Vasomotor symptoms due to menopause 04/16/2018  ? Family history of breast cancer   ? Osteoporosis   ? Anxiety   ? ? ?Past Surgical History:  ?Procedure Laterality Date  ? COLONOSCOPY  08/2010  ? Dr. Bluford Kaufmann at [facility]; repeat due in 10 yrs.  ? DILATION AND CURETTAGE OF UTERUS    ? x2; [facility]  ? TUBAL LIGATION  1991  ? ? ?OB History   ? ? Gravida  ?3  ? Para  ?3  ? Term  ?3  ? Preterm  ?   ? AB  ?   ? Living  ?3  ?  ? ? SAB  ?   ? IAB  ?   ? Ectopic  ?   ? Multiple  ?   ? Live Births  ?3  ?   ?  ?  ? ? ? ?Home Medications   ? ?Prior to Admission medications   ?Medication Sig Start Date End Date Taking? Authorizing Provider  ?ALPRAZolam (XANAX) 0.5 MG tablet Take 0.5 mg by mouth 2 (two) times daily as needed. 04/08/18  Yes [provider]  ?escitalopram (LEXAPRO) 20 MG tablet TAKE 1 TABLET BY MOUTH 1 TIME A DAY 05/14/19  Yes [provider]  ?ferrous gluconate (FERGON) 324 MG tablet Take 324 mg by mouth daily with breakfast.   Yes [provider]   ?triamterene-hydrochlorothiazide (MAXZIDE-25) 37.5-25 MG tablet Take by mouth daily as needed. 03/04/22  Yes [provider]  ?Vitamin D, Ergocalciferol, (DRISDOL) 1.25 MG (50000 UT) CAPS capsule  12/18/18  Yes [provider]  ?albuterol (VENTOLIN HFA) 108 (90 Base) MCG/ACT inhaler Inhale 1-2 puffs into the lungs every 6 (six) hours as needed for wheezing or shortness of breath. 04/07/22  Yes Mickie Bail, NP  ?predniSONE (STERAPRED UNI-PAK 21 TAB) 10 MG (21) TBPK tablet Take by mouth daily. As directed 04/08/22  Yes Mickie Bail, NP  ? ? ?Family History ?Family History  ?Problem Relation Age of Onset  ? Breast cancer Sister 42  ? Breast cancer Maternal Grandmother 60  ? Breast cancer Sister 24  ?     gene neg at [facility] about 2016  ? Heart disease Father   ? ? ?Social History ?Social History  ? ?Tobacco Use  ? Smoking status: Never  ? Smokeless tobacco: Never  ?Vaping Use  ? Vaping Use:  Never used  ?Substance Use Topics  ? Alcohol use: Not Currently  ? Drug use: Never  ? ? ? ?Allergies   ?Tetracyclines & related ? ? ?Review of Systems ?Review of Systems  ?Constitutional:  Negative for chills and fever.  ?HENT:  Negative for ear pain and sore throat.   ?Respiratory:  Positive for shortness of breath and wheezing. Negative for cough.   ?Cardiovascular:  Negative for chest pain and palpitations.  ?Gastrointestinal:  Negative for diarrhea and vomiting.  ?Skin:  Positive for rash.  ?All other systems reviewed and are negative. ? ? ?Physical Exam ?Triage Vital Signs ?ED Triage Vitals  ?Enc Vitals Group  ?   BP 04/07/22 1332 114/74  ?   Pulse Rate 04/07/22 1332 75  ?   Resp 04/07/22 1332 18  ?   Temp 04/07/22 1332 98.4 ?F (36.9 ?C)  ?   Temp src --   ?   SpO2 04/07/22 1332 95 %  ?   Weight --   ?   Height --   ?   Head Circumference --   ?   Peak Flow --   ?   Pain Score 04/07/22 1341 0  ?   Pain Loc --   ?   Pain Edu? --   ?   Excl. in [place]? --   ? ?No data found. ? ?Updated Vital Signs ?BP 114/74   Pulse 75    Temp 98.4 ?F (36.9 ?C)   Resp 18   SpO2 95%  ? ?Visual Acuity ?Right Eye Distance:   ?Left Eye Distance:   ?Bilateral Distance:   ? ?Right Eye Near:   ?Left Eye Near:    ?Bilateral Near:    ? ?Physical Exam ?Vitals and nursing note reviewed.  ?Constitutional:   ?   General: She is not in acute distress. ?   Appearance: Normal appearance. She is well-developed. She is not ill-appearing.  ?HENT:  ?   Right Ear: Tympanic membrane normal.  ?   Left Ear: Tympanic membrane normal.  ?   Nose: Nose normal.  ?   Mouth/Throat:  ?   Mouth: Mucous membranes are moist.  ?   Pharynx: Oropharynx is clear.  ?Eyes:  ?   Conjunctiva/sclera: Conjunctivae normal.  ?   Pupils: Pupils are equal, round, and reactive to light.  ?Cardiovascular:  ?   Rate and Rhythm: Normal rate and regular rhythm.  ?   Heart sounds: Normal heart sounds.  ?Pulmonary:  ?   Effort: Pulmonary effort is normal. No respiratory distress.  ?   Breath sounds: Normal breath sounds. No wheezing.  ?Abdominal:  ?   Palpations: Abdomen is soft.  ?   Tenderness: There is no abdominal tenderness.  ?Musculoskeletal:  ?   Cervical back: Neck supple.  ?Skin: ?   General: Skin is warm and dry.  ?   Findings: Rash present.  ?   Comments: Diffuse maculopapular rash on back and extremities.  ?Neurological:  ?   Mental Status: She is alert.  ?Psychiatric:     ?   Mood and Affect: Mood normal.     ?   Behavior: Behavior normal.  ? ? ? ?UC Treatments / Results  ?Labs ?(all labs ordered are listed, but only abnormal results are displayed) ?Labs Reviewed - No data to display ? ?EKG ? ? ?Radiology ?No results found. ? ?Procedures ?Procedures (including critical care time) ? ?Medications Ordered in UC ?Medications  ?methylPREDNISolone sodium succinate (SOLU-MEDROL) 125  mg/2 mL injection 80 mg (80 mg Intramuscular Given 04/07/22 1410)  ? ? ?Initial Impression / Assessment and Plan / UC Course  ?I have reviewed the triage vital signs and the nursing notes. ? ?Pertinent labs & imaging  results that were available during my care of the patient were reviewed by me and considered in my medical decision making (see chart for details). ? ?Contact dermatitis due to poison sumac.  Asthma exacerbation.  No respiratory distress, O2 sat 95% on room air.  Patient reports history of asthma; she does not have an albuterol inhaler to use currently.  She has a diffuse poison sumac rash on her back and extremities.  Treating today with Solu-Medrol injection; start prednisone taper tomorrow.  Also discussed Zyrtec or Benadryl.  Also treating her asthma exacerbation with albuterol inhaler.  Instructed patient to follow-up with her PCP.  ED precautions discussed.  Patient agrees to plan of care.   ? ?Final Clinical Impressions(s) / UC Diagnoses  ? ?Final diagnoses:  ?Contact dermatitis due to poison sumac  ?Asthma with acute exacerbation, unspecified asthma severity, unspecified whether persistent  ? ? ? ?Discharge Instructions   ? ?  ?You were given an injection of a steroid called Solu-Medrol.  Start the prednisone taper tomorrow as directed.   ? ?Take Benadryl or Zyrtec as directed.   ? ?Use the albuterol inhaler as directed.   ? ?Go to the emergency department if you have shortness of breath or other concerning symptoms.   ? ?Follow-up with your primary care provider.   ? ? ? ? ?ED Prescriptions   ? ? Medication Sig Dispense Auth. Provider  ? predniSONE (STERAPRED UNI-PAK 21 TAB) 10 MG (21) TBPK tablet Take by mouth daily. As directed 21 tablet Sharion Balloon, NP  ? albuterol (VENTOLIN HFA) 108 (90 Base) MCG/ACT inhaler Inhale 1-2 puffs into the lungs every 6 (six) hours as needed for wheezing or shortness of breath. 18 g Sharion Balloon, NP  ? ?  ? ?PDMP not reviewed this encounter. ?  ?Sharion Balloon, NP ?04/07/22 1413 ? ?

## 2022-04-07 NOTE — Discharge Instructions (Addendum)
You were given an injection of a steroid called Solu-Medrol.  Start the prednisone taper tomorrow as directed.   ? ?Take Benadryl or Zyrtec as directed.   ? ?Use the albuterol inhaler as directed.   ? ?Go to the emergency department if you have shortness of breath or other concerning symptoms.   ? ?Follow-up with your primary care provider.   ?

## 2022-04-07 NOTE — ED Triage Notes (Signed)
Pt presents with poison oak on her arms, legs, and back x 1 week. She also feels like she cant get a deep breath in.  ?

## 2022-04-22 DIAGNOSIS — M810 Age-related osteoporosis without current pathological fracture: Secondary | ICD-10-CM | POA: Diagnosis not present

## 2022-04-22 DIAGNOSIS — R7303 Prediabetes: Secondary | ICD-10-CM | POA: Diagnosis not present

## 2022-04-22 DIAGNOSIS — R69 Illness, unspecified: Secondary | ICD-10-CM | POA: Diagnosis not present

## 2022-04-22 DIAGNOSIS — R5383 Other fatigue: Secondary | ICD-10-CM | POA: Diagnosis not present

## 2022-05-13 DIAGNOSIS — Z01419 Encounter for gynecological examination (general) (routine) without abnormal findings: Secondary | ICD-10-CM | POA: Diagnosis not present

## 2022-05-13 DIAGNOSIS — Z803 Family history of malignant neoplasm of breast: Secondary | ICD-10-CM | POA: Diagnosis not present

## 2022-05-13 DIAGNOSIS — Z1339 Encounter for screening examination for other mental health and behavioral disorders: Secondary | ICD-10-CM | POA: Diagnosis not present

## 2022-05-13 DIAGNOSIS — Z1331 Encounter for screening for depression: Secondary | ICD-10-CM | POA: Diagnosis not present

## 2022-06-06 DIAGNOSIS — R69 Illness, unspecified: Secondary | ICD-10-CM | POA: Diagnosis not present

## 2022-06-06 DIAGNOSIS — R7303 Prediabetes: Secondary | ICD-10-CM | POA: Diagnosis not present

## 2022-06-06 DIAGNOSIS — Z713 Dietary counseling and surveillance: Secondary | ICD-10-CM | POA: Diagnosis not present

## 2022-07-26 DIAGNOSIS — E782 Mixed hyperlipidemia: Secondary | ICD-10-CM | POA: Diagnosis not present

## 2022-07-26 DIAGNOSIS — R7303 Prediabetes: Secondary | ICD-10-CM | POA: Diagnosis not present

## 2022-07-26 DIAGNOSIS — Z713 Dietary counseling and surveillance: Secondary | ICD-10-CM | POA: Diagnosis not present

## 2022-07-26 DIAGNOSIS — R69 Illness, unspecified: Secondary | ICD-10-CM | POA: Diagnosis not present

## 2022-08-01 DIAGNOSIS — R69 Illness, unspecified: Secondary | ICD-10-CM | POA: Diagnosis not present

## 2022-08-01 DIAGNOSIS — E782 Mixed hyperlipidemia: Secondary | ICD-10-CM | POA: Diagnosis not present

## 2022-08-01 DIAGNOSIS — R739 Hyperglycemia, unspecified: Secondary | ICD-10-CM | POA: Diagnosis not present

## 2022-08-01 DIAGNOSIS — R7989 Other specified abnormal findings of blood chemistry: Secondary | ICD-10-CM | POA: Diagnosis not present

## 2022-08-01 DIAGNOSIS — R7303 Prediabetes: Secondary | ICD-10-CM | POA: Diagnosis not present

## 2022-08-01 DIAGNOSIS — Z0000 Encounter for general adult medical examination without abnormal findings: Secondary | ICD-10-CM | POA: Diagnosis not present

## 2022-09-02 DIAGNOSIS — R7989 Other specified abnormal findings of blood chemistry: Secondary | ICD-10-CM | POA: Diagnosis not present

## 2022-09-11 DIAGNOSIS — Z03818 Encounter for observation for suspected exposure to other biological agents ruled out: Secondary | ICD-10-CM | POA: Diagnosis not present

## 2022-09-11 DIAGNOSIS — R059 Cough, unspecified: Secondary | ICD-10-CM | POA: Diagnosis not present

## 2022-09-11 DIAGNOSIS — R052 Subacute cough: Secondary | ICD-10-CM | POA: Diagnosis not present

## 2022-10-29 DIAGNOSIS — Z23 Encounter for immunization: Secondary | ICD-10-CM | POA: Diagnosis not present

## 2022-11-14 DIAGNOSIS — M810 Age-related osteoporosis without current pathological fracture: Secondary | ICD-10-CM | POA: Diagnosis not present

## 2022-12-19 DIAGNOSIS — Z1231 Encounter for screening mammogram for malignant neoplasm of breast: Secondary | ICD-10-CM | POA: Diagnosis not present

## 2022-12-26 DIAGNOSIS — Z1231 Encounter for screening mammogram for malignant neoplasm of breast: Secondary | ICD-10-CM | POA: Diagnosis not present

## 2022-12-26 DIAGNOSIS — R92323 Mammographic fibroglandular density, bilateral breasts: Secondary | ICD-10-CM | POA: Diagnosis not present

## 2023-03-31 DIAGNOSIS — L814 Other melanin hyperpigmentation: Secondary | ICD-10-CM | POA: Diagnosis not present

## 2023-03-31 DIAGNOSIS — D2362 Other benign neoplasm of skin of left upper limb, including shoulder: Secondary | ICD-10-CM | POA: Diagnosis not present

## 2023-03-31 DIAGNOSIS — L738 Other specified follicular disorders: Secondary | ICD-10-CM | POA: Diagnosis not present

## 2023-03-31 DIAGNOSIS — Z85828 Personal history of other malignant neoplasm of skin: Secondary | ICD-10-CM | POA: Diagnosis not present

## 2023-03-31 DIAGNOSIS — L821 Other seborrheic keratosis: Secondary | ICD-10-CM | POA: Diagnosis not present

## 2023-03-31 DIAGNOSIS — Z08 Encounter for follow-up examination after completed treatment for malignant neoplasm: Secondary | ICD-10-CM | POA: Diagnosis not present

## 2023-07-24 DIAGNOSIS — R5383 Other fatigue: Secondary | ICD-10-CM | POA: Diagnosis not present

## 2023-07-24 DIAGNOSIS — Z03818 Encounter for observation for suspected exposure to other biological agents ruled out: Secondary | ICD-10-CM | POA: Diagnosis not present

## 2023-07-24 DIAGNOSIS — R42 Dizziness and giddiness: Secondary | ICD-10-CM | POA: Diagnosis not present

## 2023-08-07 DIAGNOSIS — Z0000 Encounter for general adult medical examination without abnormal findings: Secondary | ICD-10-CM | POA: Diagnosis not present

## 2023-08-07 DIAGNOSIS — R5383 Other fatigue: Secondary | ICD-10-CM | POA: Diagnosis not present

## 2023-08-07 DIAGNOSIS — R7303 Prediabetes: Secondary | ICD-10-CM | POA: Diagnosis not present

## 2023-08-07 DIAGNOSIS — F411 Generalized anxiety disorder: Secondary | ICD-10-CM | POA: Diagnosis not present

## 2023-08-07 DIAGNOSIS — R7989 Other specified abnormal findings of blood chemistry: Secondary | ICD-10-CM | POA: Diagnosis not present

## 2023-08-07 DIAGNOSIS — R400 Somnolence: Secondary | ICD-10-CM | POA: Diagnosis not present

## 2023-08-07 DIAGNOSIS — E782 Mixed hyperlipidemia: Secondary | ICD-10-CM | POA: Diagnosis not present

## 2024-01-06 NOTE — Progress Notes (Signed)
PUO

## 2024-09-20 ENCOUNTER — Other Ambulatory Visit: Payer: Self-pay

## 2024-09-20 DIAGNOSIS — D259 Leiomyoma of uterus, unspecified: Secondary | ICD-10-CM

## 2024-09-20 DIAGNOSIS — N938 Other specified abnormal uterine and vaginal bleeding: Secondary | ICD-10-CM

## 2024-09-21 ENCOUNTER — Inpatient Hospital Stay: Admission: RE | Admit: 2024-09-21 | Discharge: 2024-09-21

## 2024-09-21 DIAGNOSIS — N938 Other specified abnormal uterine and vaginal bleeding: Secondary | ICD-10-CM

## 2024-09-21 DIAGNOSIS — D259 Leiomyoma of uterus, unspecified: Secondary | ICD-10-CM

## 2024-11-10 ENCOUNTER — Other Ambulatory Visit (HOSPITAL_COMMUNITY)
Admission: RE | Admit: 2024-11-10 | Discharge: 2024-11-10 | Disposition: A | Source: Ambulatory Visit | Attending: Obstetrics and Gynecology | Admitting: Obstetrics and Gynecology

## 2024-11-10 ENCOUNTER — Ambulatory Visit: Admitting: Obstetrics and Gynecology

## 2024-11-10 ENCOUNTER — Encounter: Payer: Self-pay | Admitting: Obstetrics and Gynecology

## 2024-11-10 ENCOUNTER — Other Ambulatory Visit (HOSPITAL_COMMUNITY)
Admission: RE | Admit: 2024-11-10 | Discharge: 2024-11-10 | Disposition: A | Discharge status: Home / Self Care | Source: Ambulatory Visit | Attending: Obstetrics and Gynecology | Admitting: Obstetrics and Gynecology

## 2024-11-10 VITALS — BP 123/68 | HR 78 | Ht 64.0 in | Wt 149.0 lb

## 2024-11-10 DIAGNOSIS — N950 Postmenopausal bleeding: Secondary | ICD-10-CM | POA: Diagnosis present

## 2024-11-10 DIAGNOSIS — Z1151 Encounter for screening for human papillomavirus (HPV): Secondary | ICD-10-CM | POA: Insufficient documentation

## 2024-11-10 DIAGNOSIS — Z124 Encounter for screening for malignant neoplasm of cervix: Secondary | ICD-10-CM

## 2024-11-10 DIAGNOSIS — Z114 Encounter for screening for human immunodeficiency virus [HIV]: Secondary | ICD-10-CM

## 2024-11-10 DIAGNOSIS — Z1159 Encounter for screening for other viral diseases: Secondary | ICD-10-CM

## 2024-11-10 DIAGNOSIS — Z01411 Encounter for gynecological examination (general) (routine) with abnormal findings: Secondary | ICD-10-CM

## 2024-11-10 DIAGNOSIS — Z01419 Encounter for gynecological examination (general) (routine) without abnormal findings: Secondary | ICD-10-CM | POA: Diagnosis present

## 2024-11-10 NOTE — Patient Instructions (Addendum)
 It was nice meeting you today! You will see your results in the MyChart app within 1 week  It is normal to have cramping and bleeding for the next 2-3 days. You should feel better every day.   Please call us  if you have any severe pain, bleeding that soaks more than 1 pad in a hour, have fevers, or feel like you're going to pass out.   You can take tylenol 1000mg  every 8 hours and ibuprofen 600mg  every 8 hours as needed for pain. It is ok to take both at the same time.

## 2024-11-10 NOTE — Progress Notes (Signed)
 ANNUAL EXAM Patient name: Audrey Leonard MRN [id number]  Date of birth: 1956/08/08 Chief Complaint:   New Patient (Initial Visit) (GYN)  History of Present Illness:   Audrey Leonard is a 68 y.o. [phone] with No LMP recorded. Patient is postmenopausal. being seen today for a routine annual exam.  Current complaints:   Did estrogen & testosterone pellets with [facility] in [place] 08/12/24 with goal of helping her sleep, fatigue and general menopause symptoms. She did not realize she was also supposed to be taking PO progesterone  so started it around 3 weeks later. Had 2 weeks of bleeding in October and again in November when backing off her prometrium  dose. Is still taking prometrium  200mg  daily.  Pelvic US  10/21 with EL 12mm and 1.8cm simple R ovarian cyst.   She is interested in continuing pellets because they helped her sleep and knee pain but wants to get more information.   Of note, she has not been on hormone therapy in > 10 years prior to this and FMP was at 28. She has strong family history of breast cancer (2 sisters & grandmother) but reports sisters' genetic testing was all normal.   Last pap 04/16/18. Results were: NILM w/ HRHPV negative. H/O abnormal pap: no Last mammogram: 03/08/24. Results were: normal - BI RADS 1 Last colonoscopy: 11/28/20. Results were: abnormal tubular adenoma.  DEXA: 01/28/24 c/w osteopenia     11/10/2024    3:46 PM  Depression screen PHQ 2/9  Decreased Interest 0  Down, Depressed, Hopeless 0  PHQ - 2 Score 0  Altered sleeping 1  Tired, decreased energy 0  Change in appetite 0  Feeling bad or failure about yourself  0  Trouble concentrating 0  Moving slowly or fidgety/restless 0  Suicidal thoughts 0  PHQ-9 Score 1  Difficult doing work/chores Not difficult at all        11/10/2024    3:46 PM  GAD 7 : Generalized Anxiety Score  Nervous, Anxious, on Edge 1  Control/stop worrying 1  Worry too much - different things 0  Trouble  relaxing 0  Restless 0  Easily annoyed or irritable 0  Afraid - awful might happen 0  Total GAD 7 Score 2  Anxiety Difficulty Not difficult at all   Review of Systems:   Pertinent items are noted in HPI Denies any headaches, blurred vision, fatigue, shortness of breath, chest pain, abdominal pain, abnormal vaginal discharge/itching/odor/irritation, problems with periods, bowel movements, urination, or intercourse unless otherwise stated above. Pertinent History Reviewed:  Reviewed past medical,surgical, social and family history.  Reviewed problem list, medications and allergies. Physical Assessment:   Vitals:   11/10/24 1627  BP: 123/68  Pulse: 78  Weight: 149 lb (67.6 kg)  Height: 5' 4 (1.626 m)  Body mass index is 25.58 kg/m.        Physical Examination:   General appearance - well appearing, and in no distress  Mental status - alert, oriented to person, place, and time  Chest - respiratory effort normal  Heart - normal peripheral perfusion  Breasts - offered, deferred  Pelvic - VULVA: normal appearing vulva with no masses, tenderness or lesions  VAGINA: normal appearing vagina with normal color and discharge, no lesions  CERVIX: normal appearing cervix without discharge or lesions, no CMT  Thin prep pap is done with HR HPV cotesting  UTERUS: uterus is felt to be normal size, shape, consistency and nontender   ADNEXA: No adnexal masses or tenderness noted.  Chaperone present for exam  No results found for this or any previous visit (from the past 24 hours).  Assessment & Plan:  1) Well-Woman Exam Mammogram: in 1 year, or sooner if problems Colonoscopy: per GI, likely 2026 Pap: Collected GC/CT: UTD HIV/HCV: Ordered  2) PMB - Reviewed that I strongly recommend discontinuing pellet therapy due to concerns about dosing and safety; this is in line with ACOG/NAMS recommendations - Unfortunately, she is not a great candidate for hormone therapy in general. Reviewed that  the highest risk group for complications related to CVD/stroke appears to be women in their 60s who are 10+ years from final menstrual period. She is also high risk for breast cancer with her family history and while breast cancer data around HT is now more conflicting, the risk may outweigh benefit - Uncomplicated EMB performed for PMB with EL 12mm  Labs/procedures today:   Orders Placed This Encounter  Procedures   HIV antibody (with reflex)   HCV Ab w Reflex to Quant PCR   Meds: No orders of the defined types were placed in this encounter.  Follow-up: Return pending biopsy results.  Kieth JAYSON Carolin, MD 11/10/2024 5:12 PM

## 2024-11-10 NOTE — Progress Notes (Signed)
°  °  °  GYNECOLOGY OFFICE PROCEDURE NOTE   Audrey Leonard is a 68 y.o. [phone] here for endometrial biopsy for PMB. Recent ultrasound also showed EL 12mm. Due for pap.   ENDOMETRIAL BIOPSY     The indications for endometrial biopsy were reviewed.   Risks of the biopsy including cramping, bleeding, infection, uterine perforation, inadequate specimen and need for additional procedures were discussed. Offered alternative of hysteroscopy, dilation and curettage in OR. The patient states she understands the R/B/I/A and agrees to undergo procedure today. Urine pregnancy test was Not indicated. Consent was signed. Time out was performed.    Patient was positioned in dorsal lithotomy position. A vaginal speculum was placed.  The cervix was visualized and was prepped with Betadine. 3cc of lidocaine 1% was injected intracervically at 12 o'clock.  A single-toothed tenaculum was placed on the anterior lip of the cervix to stabilize it. Os finder needed to dilate the cervix. The 3 mm pipelle was easily introduced into the endometrial cavity without difficulty to a depth of 8 cm, and a Scant amount of tissue was obtained after three passes and sent to pathology. The instruments were removed from the patient's vagina. Minimal bleeding from the cervix was noted. The patient tolerated the procedure well.   Patient was given post procedure instructions.  Will follow up pathology and manage accordingly; patient will be contacted with results and recommendations.  Routine preventative health maintenance measures emphasized.     Kieth Carolin, MD Obstetrician & Gynecologist, [facility] for [employer], [facility]

## 2024-11-10 NOTE — Progress Notes (Addendum)
 68 y.o. New GYN presents for AEX/PAP. C/o bleeding due to fibroids, US  09/21/24.  Last Mammogram 03/08/2024 Negative

## 2024-11-11 ENCOUNTER — Ambulatory Visit: Payer: Self-pay | Admitting: Obstetrics and Gynecology

## 2024-11-11 LAB — HCV AB W REFLEX TO QUANT PCR: HCV Ab: NONREACTIVE

## 2024-11-11 LAB — HCV INTERPRETATION

## 2024-11-11 LAB — HIV ANTIBODY (ROUTINE TESTING W REFLEX): HIV Screen 4th Generation wRfx: NONREACTIVE

## 2024-11-12 LAB — SURGICAL PATHOLOGY

## 2024-11-16 LAB — CYTOLOGY - PAP
Comment: NEGATIVE
Comment: NEGATIVE
Comment: NEGATIVE
Diagnosis: NEGATIVE
HPV 16: NEGATIVE
HPV 18 / 45: NEGATIVE
High risk HPV: POSITIVE — AB
